# Patient Record
Sex: Female | Born: 1960 | Race: White | Hispanic: No | Marital: Married | Smoking: Former smoker
Health system: Southern US, Community
[De-identification: ages and names within clinical notes are randomized; demographics above are authoritative.]

---

## 1998-03-30 ENCOUNTER — Other Ambulatory Visit: Admission: RE | Admit: 1998-03-30 | Discharge: 1998-03-30 | Payer: Self-pay | Admitting: Obstetrics and Gynecology

## 2004-11-25 ENCOUNTER — Encounter: Admission: RE | Admit: 2004-11-25 | Discharge: 2004-11-25 | Payer: Self-pay | Admitting: General Surgery

## 2006-04-18 ENCOUNTER — Encounter: Admission: RE | Admit: 2006-04-18 | Discharge: 2006-04-18 | Payer: Self-pay | Admitting: Obstetrics and Gynecology

## 2006-04-28 ENCOUNTER — Encounter: Admission: RE | Admit: 2006-04-28 | Discharge: 2006-04-28 | Payer: Self-pay | Admitting: Obstetrics and Gynecology

## 2007-04-06 ENCOUNTER — Emergency Department (HOSPITAL_COMMUNITY): Admission: EM | Admit: 2007-04-06 | Discharge: 2007-04-06 | Payer: Self-pay | Admitting: Family Medicine

## 2007-05-08 ENCOUNTER — Encounter: Admission: RE | Admit: 2007-05-08 | Discharge: 2007-05-08 | Payer: Self-pay | Admitting: Obstetrics and Gynecology

## 2008-05-08 ENCOUNTER — Encounter: Admission: RE | Admit: 2008-05-08 | Discharge: 2008-05-08 | Payer: Self-pay | Admitting: Obstetrics and Gynecology

## 2009-09-29 ENCOUNTER — Encounter: Admission: RE | Admit: 2009-09-29 | Discharge: 2009-09-29 | Payer: Self-pay | Admitting: Obstetrics and Gynecology

## 2010-01-12 ENCOUNTER — Encounter: Admission: RE | Admit: 2010-01-12 | Discharge: 2010-01-12 | Payer: Self-pay | Admitting: Internal Medicine

## 2010-01-12 ENCOUNTER — Other Ambulatory Visit: Admission: RE | Admit: 2010-01-12 | Discharge: 2010-01-12 | Payer: Self-pay | Admitting: Interventional Radiology

## 2010-09-30 ENCOUNTER — Encounter
Admission: RE | Admit: 2010-09-30 | Discharge: 2010-09-30 | Payer: Self-pay | Source: Home / Self Care | Attending: Internal Medicine | Admitting: Internal Medicine

## 2011-01-05 IMAGING — US US BIOPSY
1 series · 13 of 24 positions shown · non-contrast
Comparison: Ultrasound from outside facility revealed a right lower
pole thyroid nodule measuring 1.6 cm in maximum dimension.

CLINICAL DATA: Palpable thyroid nodule.

ULTRASOUND-GUIDED NEEDLE ASPIRATE BIOPSY x2, RIGHT AND LEFT LOBE OF
THYROID
The above procedure was discussed with the patient and written
informed consent was obtained.

[Series 1: us biopsy · 0.08mm/px · 24 acquisitions, 13 frames shown]
[im 1/24]
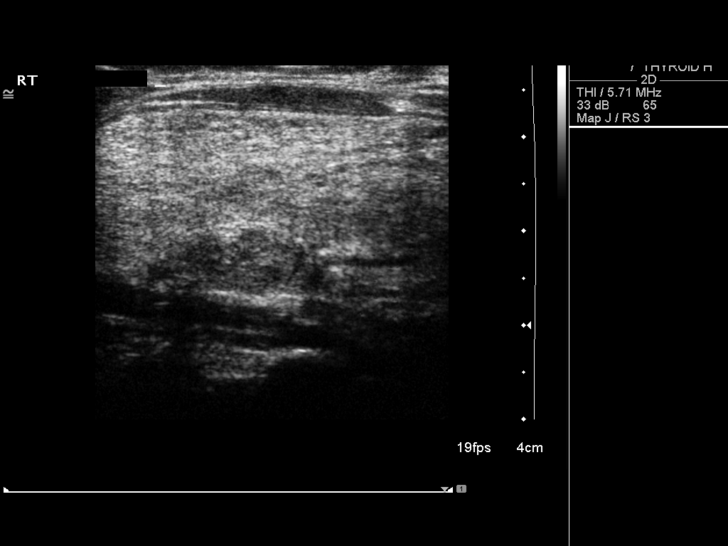
[im 3/24]
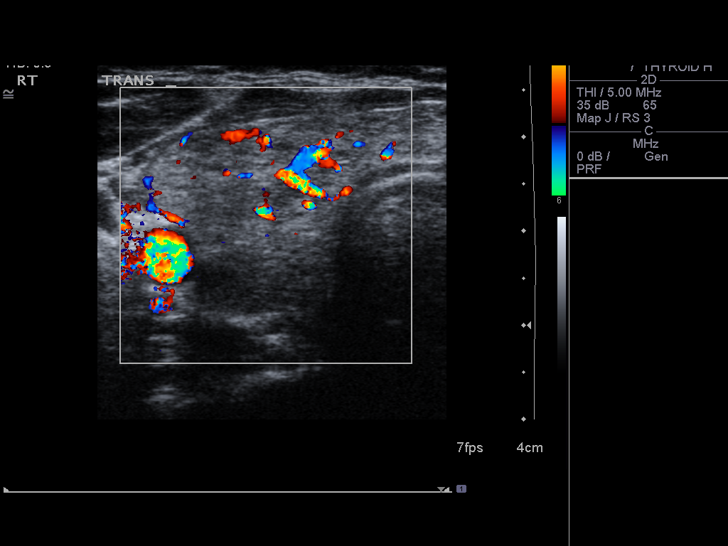
[im 5/24]
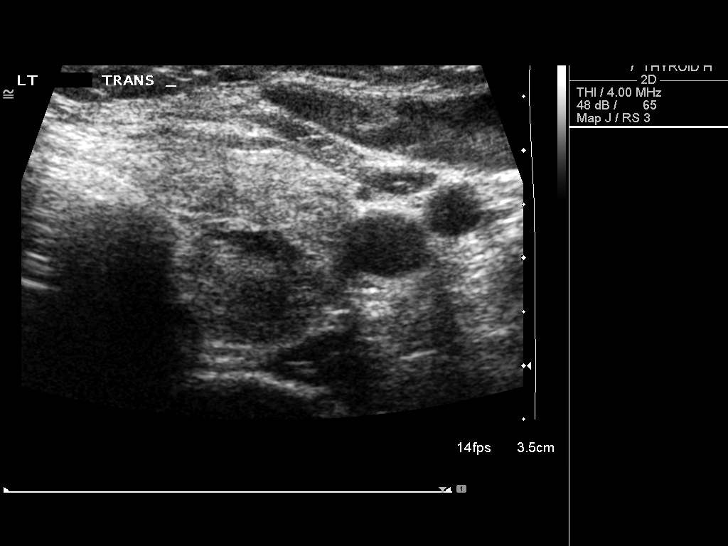
[im 7/24]
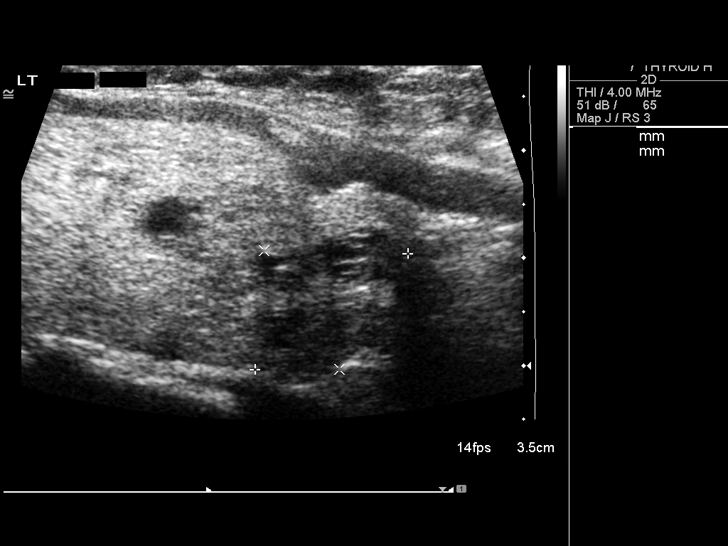
[im 9/24]
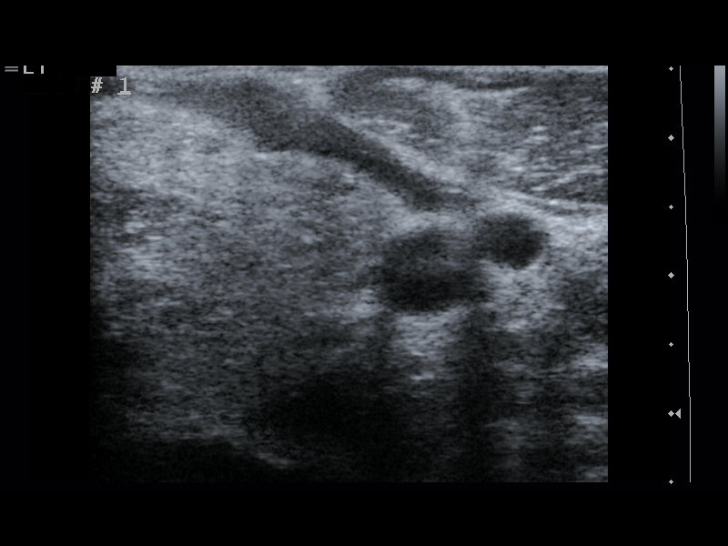
[im 11/24]
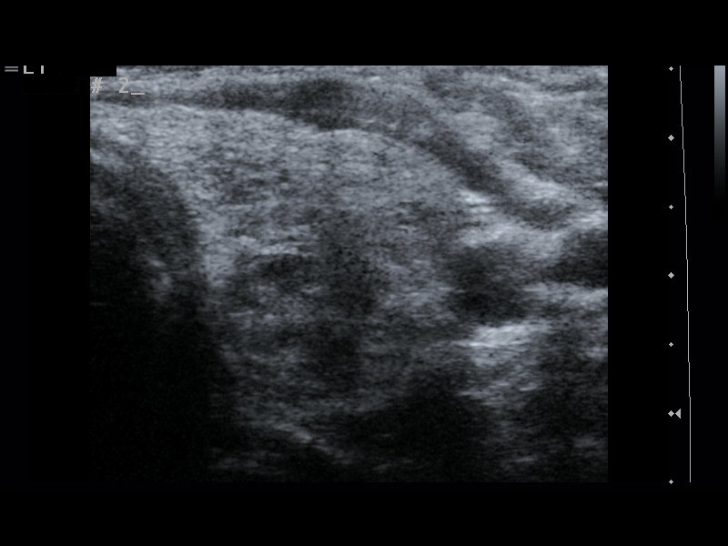
[im 13/24]
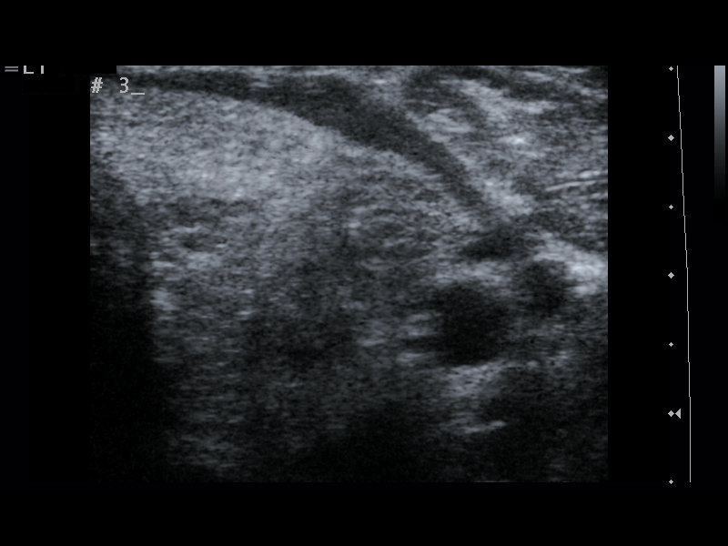
[im 14/24]
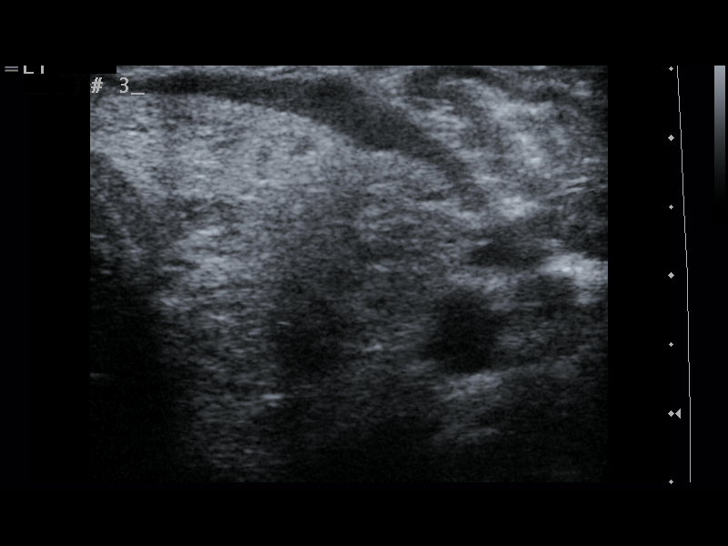
[im 16/24]
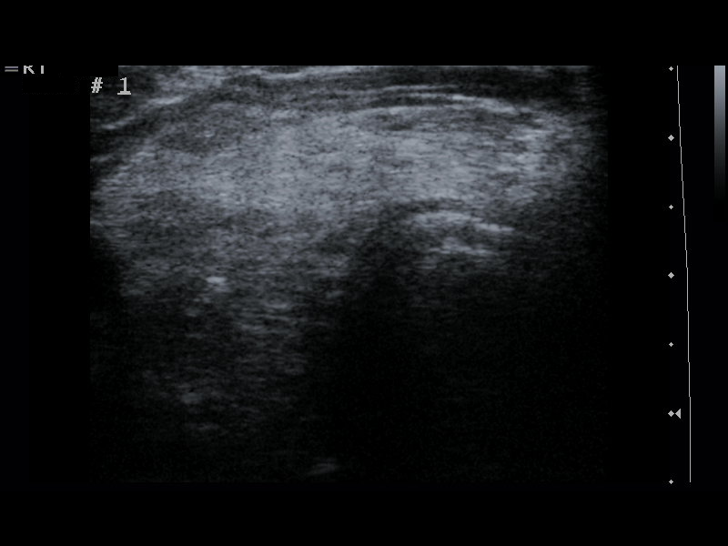
[im 18/24]
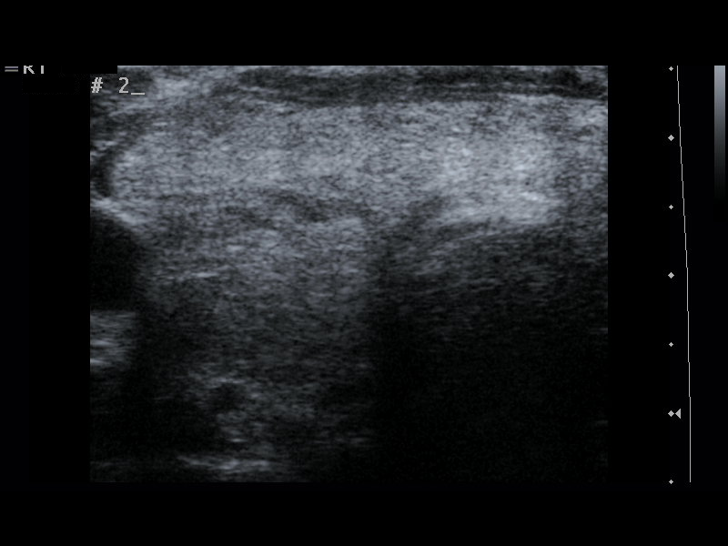
[im 20/24]
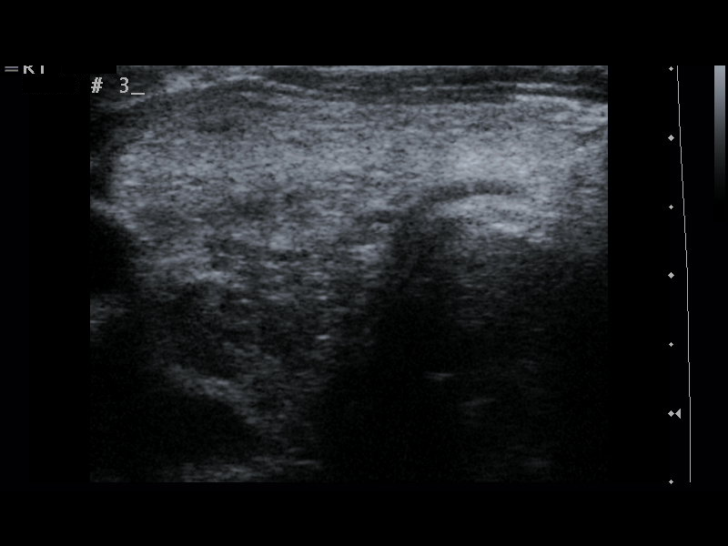
[im 22/24]
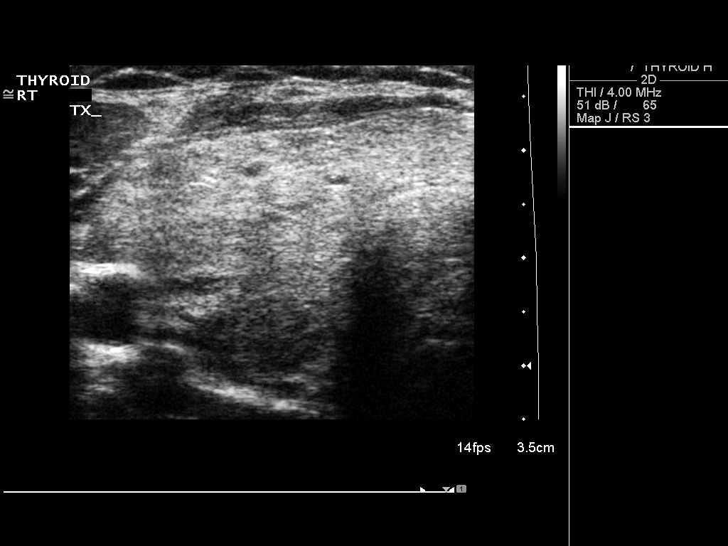
[im 24/24]
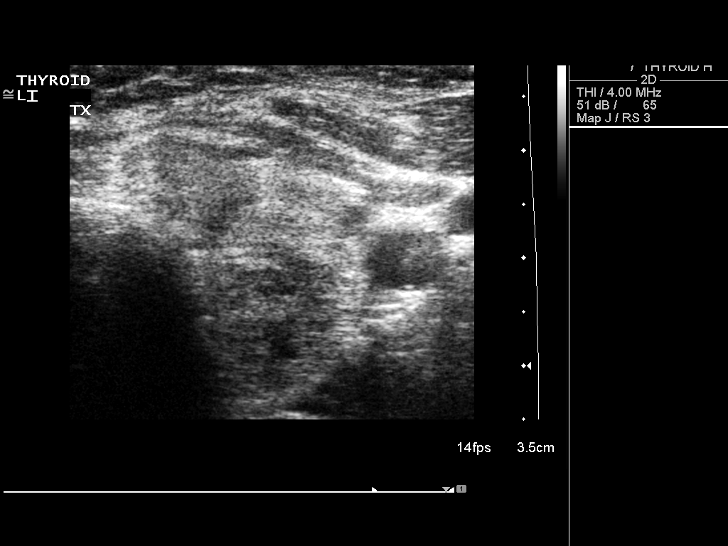

[13 of 24 positions shown; findings below may reference images not displayed]

Ultrasound of the thyroid today reveals a left lower pole thyroid
nodule measuring 1.8 x 1.3 which is not seen on outside imaging.
Plan for biopsy of this nodule today as well as it meets National
criteria status for biopsy.
FINDINGS: Ultrasound was performed to localize and mark an adequate
site for the biopsy.  The patient was then prepped and draped in a
normal sterile fashion.  Local anesthesia was provided with 1%
lidocaine.  Using direct ultrasound guidance, three passes were
made using 25 gauge needles into the nodule within the right lower
lobe of the thyroid.  Ultrasound was used to confirm needle
placements on all occasions.

Ultrasound was performed over the left neck to localize and mark an
adequate site for biopsy.  Local anesthesia was provided with 1%
lidocaine.  Using direct ultrasound guidance, three passes were
made using 25 gauge needles into the nodule within the left lower
lobe of the thyroid.  Ultrasound was used confirm needle placement
on all occasions.

Specimens were sent to Pathology for analysis.  Post procedural
imaging demonstrated no hematoma or immediate complication.  The
patient tolerated the procedure well.
IMPRESSION: Successful fine needle aspirate of right lower pole thyroid nodule
and left lower pole thyroid nodule as described above.

Read by: Noeg, Putu Steven.-MELBORN

## 2012-04-26 ENCOUNTER — Other Ambulatory Visit: Payer: Self-pay | Admitting: Internal Medicine

## 2012-04-26 DIAGNOSIS — Z1231 Encounter for screening mammogram for malignant neoplasm of breast: Secondary | ICD-10-CM

## 2012-05-08 ENCOUNTER — Ambulatory Visit
Admission: RE | Admit: 2012-05-08 | Discharge: 2012-05-08 | Disposition: A | Discharge status: Home / Self Care | Payer: BC Managed Care – PPO | Source: Ambulatory Visit | Attending: Internal Medicine | Admitting: Internal Medicine

## 2012-05-08 DIAGNOSIS — Z1231 Encounter for screening mammogram for malignant neoplasm of breast: Secondary | ICD-10-CM

## 2012-05-14 ENCOUNTER — Other Ambulatory Visit: Payer: Self-pay | Admitting: Internal Medicine

## 2012-05-14 DIAGNOSIS — R928 Other abnormal and inconclusive findings on diagnostic imaging of breast: Secondary | ICD-10-CM

## 2012-05-18 ENCOUNTER — Ambulatory Visit
Admission: RE | Admit: 2012-05-18 | Discharge: 2012-05-18 | Disposition: A | Discharge status: Home / Self Care | Payer: BC Managed Care – PPO | Source: Ambulatory Visit | Attending: Internal Medicine | Admitting: Internal Medicine

## 2012-05-18 DIAGNOSIS — R928 Other abnormal and inconclusive findings on diagnostic imaging of breast: Secondary | ICD-10-CM

## 2013-05-06 ENCOUNTER — Other Ambulatory Visit: Payer: Self-pay

## 2013-05-06 DIAGNOSIS — Z1231 Encounter for screening mammogram for malignant neoplasm of breast: Secondary | ICD-10-CM

## 2013-05-21 ENCOUNTER — Ambulatory Visit
Admission: RE | Admit: 2013-05-21 | Discharge: 2013-05-21 | Disposition: A | Discharge status: Home / Self Care | Payer: BC Managed Care – PPO | Source: Ambulatory Visit

## 2013-05-21 DIAGNOSIS — Z1231 Encounter for screening mammogram for malignant neoplasm of breast: Secondary | ICD-10-CM

## 2013-05-27 ENCOUNTER — Other Ambulatory Visit: Payer: Self-pay | Admitting: Internal Medicine

## 2013-05-27 DIAGNOSIS — R928 Other abnormal and inconclusive findings on diagnostic imaging of breast: Secondary | ICD-10-CM

## 2013-06-07 ENCOUNTER — Ambulatory Visit
Admission: RE | Admit: 2013-06-07 | Discharge: 2013-06-07 | Disposition: A | Discharge status: Home / Self Care | Payer: BC Managed Care – PPO | Source: Ambulatory Visit | Attending: Internal Medicine | Admitting: Internal Medicine

## 2013-06-07 DIAGNOSIS — R928 Other abnormal and inconclusive findings on diagnostic imaging of breast: Secondary | ICD-10-CM

## 2013-06-14 ENCOUNTER — Other Ambulatory Visit: Payer: BC Managed Care – PPO

## 2014-05-09 ENCOUNTER — Other Ambulatory Visit: Payer: Self-pay

## 2014-05-09 DIAGNOSIS — Z1231 Encounter for screening mammogram for malignant neoplasm of breast: Secondary | ICD-10-CM

## 2014-08-05 ENCOUNTER — Ambulatory Visit
Admission: RE | Admit: 2014-08-05 | Discharge: 2014-08-05 | Disposition: A | Discharge status: Home / Self Care | Payer: BC Managed Care – PPO | Source: Ambulatory Visit

## 2014-08-05 DIAGNOSIS — Z1231 Encounter for screening mammogram for malignant neoplasm of breast: Secondary | ICD-10-CM

## 2016-01-27 ENCOUNTER — Other Ambulatory Visit: Payer: Self-pay

## 2016-01-27 DIAGNOSIS — Z1231 Encounter for screening mammogram for malignant neoplasm of breast: Secondary | ICD-10-CM

## 2016-03-25 ENCOUNTER — Ambulatory Visit
Admission: RE | Admit: 2016-03-25 | Discharge: 2016-03-25 | Disposition: A | Discharge status: Home / Self Care | Payer: BC Managed Care – PPO | Source: Ambulatory Visit

## 2016-03-25 DIAGNOSIS — Z1231 Encounter for screening mammogram for malignant neoplasm of breast: Secondary | ICD-10-CM

## 2016-08-11 ENCOUNTER — Encounter: Payer: Self-pay | Admitting: Internal Medicine

## 2017-04-19 ENCOUNTER — Other Ambulatory Visit: Payer: Self-pay | Admitting: Internal Medicine

## 2017-04-19 DIAGNOSIS — Z1231 Encounter for screening mammogram for malignant neoplasm of breast: Secondary | ICD-10-CM

## 2017-04-28 ENCOUNTER — Ambulatory Visit
Admission: RE | Admit: 2017-04-28 | Discharge: 2017-04-28 | Disposition: A | Discharge status: Home / Self Care | Payer: BC Managed Care – PPO | Source: Ambulatory Visit | Attending: Internal Medicine | Admitting: Internal Medicine

## 2017-04-28 DIAGNOSIS — Z1231 Encounter for screening mammogram for malignant neoplasm of breast: Secondary | ICD-10-CM

## 2017-11-09 ENCOUNTER — Encounter: Payer: Self-pay | Admitting: Internal Medicine

## 2018-04-20 ENCOUNTER — Ambulatory Visit (INDEPENDENT_AMBULATORY_CARE_PROVIDER_SITE_OTHER): Payer: BC Managed Care – PPO

## 2018-04-20 ENCOUNTER — Encounter: Payer: Self-pay | Admitting: Podiatry

## 2018-04-20 ENCOUNTER — Ambulatory Visit (INDEPENDENT_AMBULATORY_CARE_PROVIDER_SITE_OTHER): Payer: BC Managed Care – PPO | Admitting: Podiatry

## 2018-04-20 VITALS — BP 131/76 | HR 66 | Resp 16

## 2018-04-20 DIAGNOSIS — S99921A Unspecified injury of right foot, initial encounter: Secondary | ICD-10-CM | POA: Diagnosis not present

## 2018-04-20 DIAGNOSIS — M722 Plantar fascial fibromatosis: Secondary | ICD-10-CM | POA: Diagnosis not present

## 2018-04-20 DIAGNOSIS — S90121A Contusion of right lesser toe(s) without damage to nail, initial encounter: Secondary | ICD-10-CM | POA: Diagnosis not present

## 2018-04-20 MED ORDER — MELOXICAM 15 MG PO TABS: 15.0000 mg | ORAL_TABLET | Freq: Every day | ORAL | 0 refills | Status: DC

## 2018-04-22 NOTE — Progress Notes (Signed)
  Subjective:  Patient ID: Jade Gibson, female    DOB: 05/10/1961,  MRN: [id number]  Chief Complaint  Patient presents with  . Toe Pain    2nd toe right - pt was stretching foot x 2 days ago due to plantar fasciitis symptoms and felt something pull in toe-now sore, bruised and swollen areound base of nail  . Toe Pain    3rd toe right - [place], still tender, red and swollen  . New Patient (Initial Visit)    57 y.o. female presents with the above complaints.  Reports pain to the right second third toes as above. No past medical history on file. No past surgical history on file.  Current Outpatient Medications:  .  meloxicam (MOBIC) 15 MG tablet, Take 1 tablet (15 mg total) by mouth daily., Disp: 30 tablet, Rfl: 0  No Known Allergies Review of Systems: Negative except as noted in the HPI. Denies N/V/F/Ch. Objective:   Vitals:   04/20/18 1019  BP: 131/76  Pulse: 66  Resp: 16   General AA&O x3. Normal mood and affect.  Vascular Dorsalis pedis and posterior tibial pulses  present 2+ bilaterally  Capillary refill normal to all digits. Pedal hair growth normal.  Neurologic Epicritic sensation grossly present.  Dermatologic No open lesions. Interspaces clear of maceration. Nails well groomed and normal in appearance.  Orthopedic: MMT 5/5 in dorsiflexion, plantarflexion, inversion, and eversion. Pain palpation right second MPJ.  Pain palpation right third toe distally.  Positive modified Lachman test   Assessment & Plan:  Patient was evaluated and treated and all questions answered.  Plantar plate injury right second toe -X-rays taken reviewed no acute fractures dislocations. -Right second toe taped in plantar flexion. -Patient educated on performing this taping technique -Rx meloxicam  Return in about 6 weeks (around 06/01/2018) for Plantar PLate injury toe.

## 2018-05-02 ENCOUNTER — Other Ambulatory Visit: Payer: Self-pay | Admitting: Internal Medicine

## 2018-05-02 DIAGNOSIS — Z1231 Encounter for screening mammogram for malignant neoplasm of breast: Secondary | ICD-10-CM

## 2018-05-04 ENCOUNTER — Ambulatory Visit
Admission: RE | Admit: 2018-05-04 | Discharge: 2018-05-04 | Disposition: A | Discharge status: Home / Self Care | Payer: BC Managed Care – PPO | Source: Ambulatory Visit | Attending: Internal Medicine | Admitting: Internal Medicine

## 2018-05-04 DIAGNOSIS — Z1231 Encounter for screening mammogram for malignant neoplasm of breast: Secondary | ICD-10-CM

## 2018-05-07 ENCOUNTER — Other Ambulatory Visit: Payer: BC Managed Care – PPO | Admitting: Orthotics

## 2018-05-14 ENCOUNTER — Other Ambulatory Visit: Payer: Self-pay | Admitting: Podiatry

## 2018-05-31 ENCOUNTER — Ambulatory Visit: Payer: BC Managed Care – PPO | Admitting: Podiatry

## 2021-11-01 ENCOUNTER — Other Ambulatory Visit: Payer: Self-pay | Admitting: Internal Medicine

## 2021-11-01 DIAGNOSIS — E041 Nontoxic single thyroid nodule: Secondary | ICD-10-CM

## 2021-11-01 DIAGNOSIS — E785 Hyperlipidemia, unspecified: Secondary | ICD-10-CM

## 2021-11-26 ENCOUNTER — Ambulatory Visit
Admission: RE | Admit: 2021-11-26 | Discharge: 2021-11-26 | Disposition: A | Discharge status: Home / Self Care | Payer: BC Managed Care – PPO | Source: Ambulatory Visit | Attending: Internal Medicine | Admitting: Internal Medicine

## 2021-11-26 DIAGNOSIS — E041 Nontoxic single thyroid nodule: Secondary | ICD-10-CM

## 2021-12-01 ENCOUNTER — Other Ambulatory Visit: Payer: Self-pay | Admitting: Internal Medicine

## 2021-12-01 DIAGNOSIS — E041 Nontoxic single thyroid nodule: Secondary | ICD-10-CM

## 2021-12-27 ENCOUNTER — Other Ambulatory Visit (HOSPITAL_COMMUNITY)
Admission: RE | Admit: 2021-12-27 | Discharge: 2021-12-27 | Disposition: A | Discharge status: Home / Self Care | Payer: BC Managed Care – PPO | Source: Ambulatory Visit | Attending: Interventional Radiology | Admitting: Interventional Radiology

## 2021-12-27 ENCOUNTER — Ambulatory Visit
Admission: RE | Admit: 2021-12-27 | Discharge: 2021-12-27 | Disposition: A | Discharge status: Home / Self Care | Payer: BC Managed Care – PPO | Source: Ambulatory Visit | Attending: Internal Medicine | Admitting: Internal Medicine

## 2021-12-27 DIAGNOSIS — E041 Nontoxic single thyroid nodule: Secondary | ICD-10-CM | POA: Diagnosis not present

## 2021-12-29 LAB — CYTOLOGY - NON PAP

## 2022-06-20 ENCOUNTER — Other Ambulatory Visit: Payer: Self-pay | Admitting: Internal Medicine

## 2022-06-20 DIAGNOSIS — Z1231 Encounter for screening mammogram for malignant neoplasm of breast: Secondary | ICD-10-CM

## 2022-07-11 ENCOUNTER — Ambulatory Visit
Admission: RE | Admit: 2022-07-11 | Discharge: 2022-07-11 | Disposition: A | Discharge status: Home / Self Care | Payer: BC Managed Care – PPO | Source: Ambulatory Visit | Attending: Internal Medicine | Admitting: Internal Medicine

## 2022-07-11 DIAGNOSIS — Z1231 Encounter for screening mammogram for malignant neoplasm of breast: Secondary | ICD-10-CM

## 2022-11-11 ENCOUNTER — Other Ambulatory Visit: Payer: Self-pay | Admitting: Internal Medicine

## 2022-11-11 DIAGNOSIS — E785 Hyperlipidemia, unspecified: Secondary | ICD-10-CM

## 2022-11-11 DIAGNOSIS — E041 Nontoxic single thyroid nodule: Secondary | ICD-10-CM

## 2022-11-19 IMAGING — US US THYROID
1 series · 13 of 25 positions shown · non-contrast
Comparison: None.

CLINICAL DATA: Goiter.

EXAM:
THYROID ULTRASOUND
TECHNIQUE: Ultrasound examination of the thyroid gland and adjacent soft
tissues was performed.

[Series 1: us thyroid · 0.08mm/px · 13 of 41 slices shown]
[im 1/41]
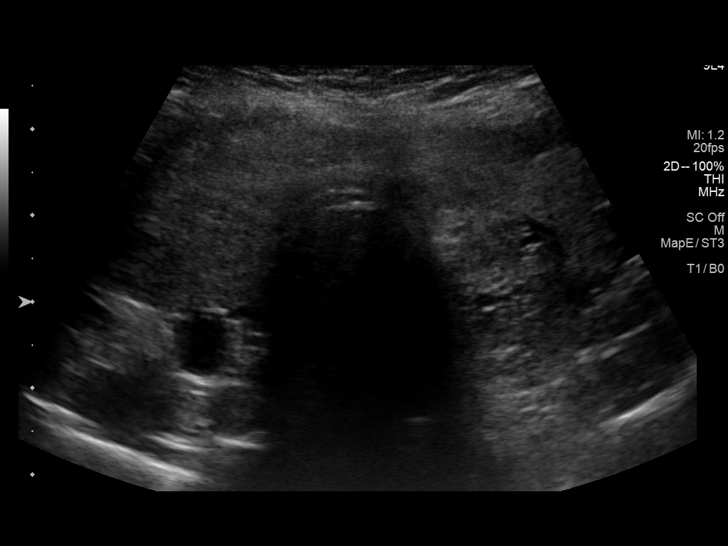
[im 4/41]
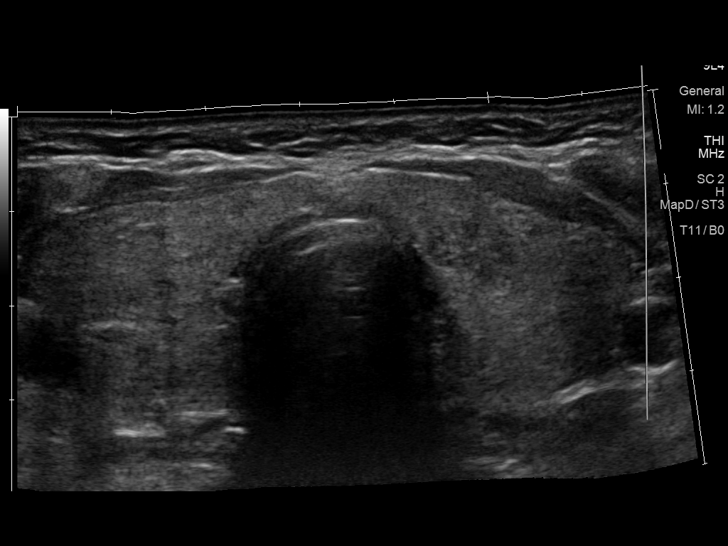
[im 7/41]
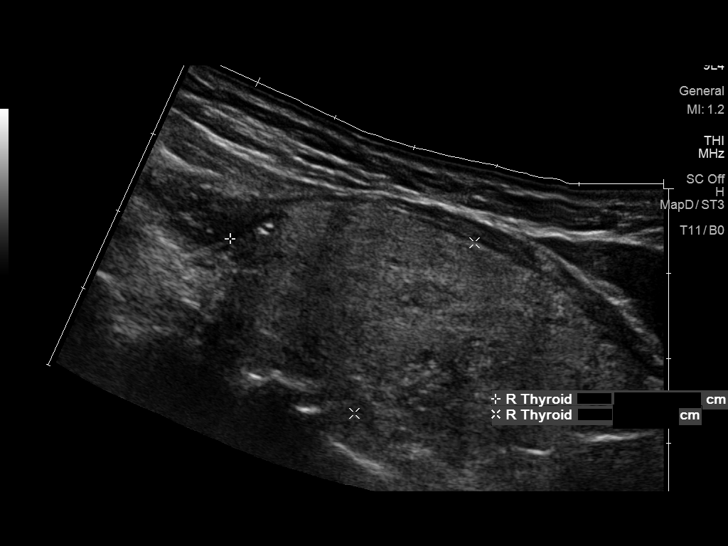
[im 11/41]
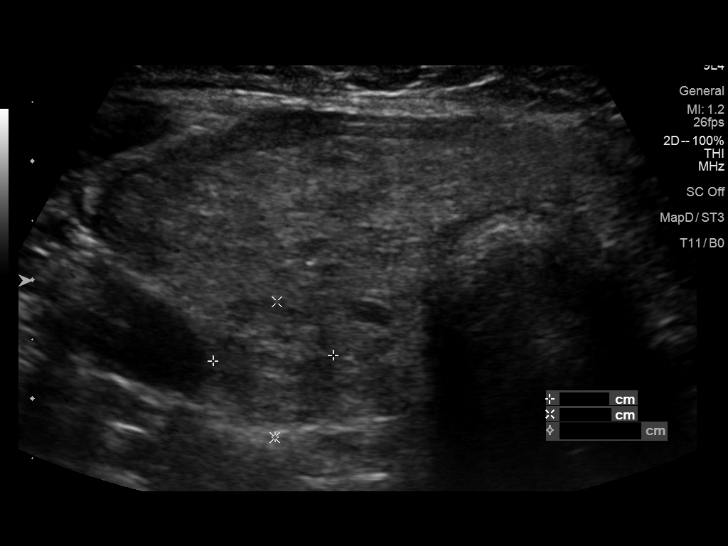
[im 14/41]
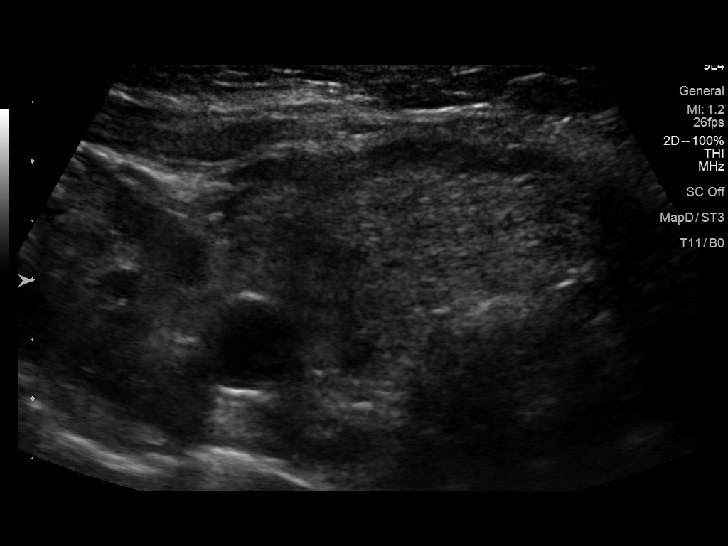
[im 17/41]
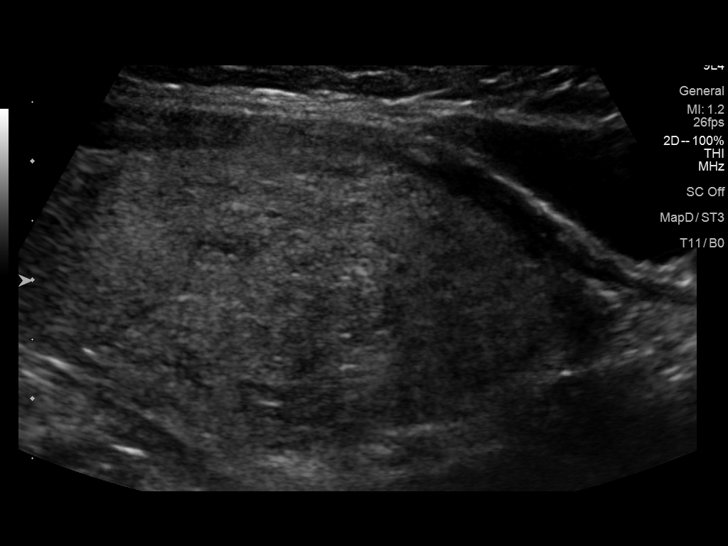
[im 21/41]
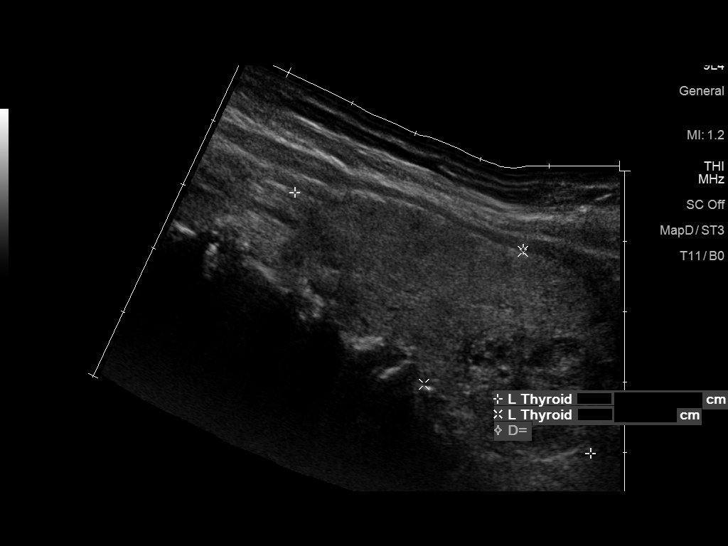
[im 24/41]
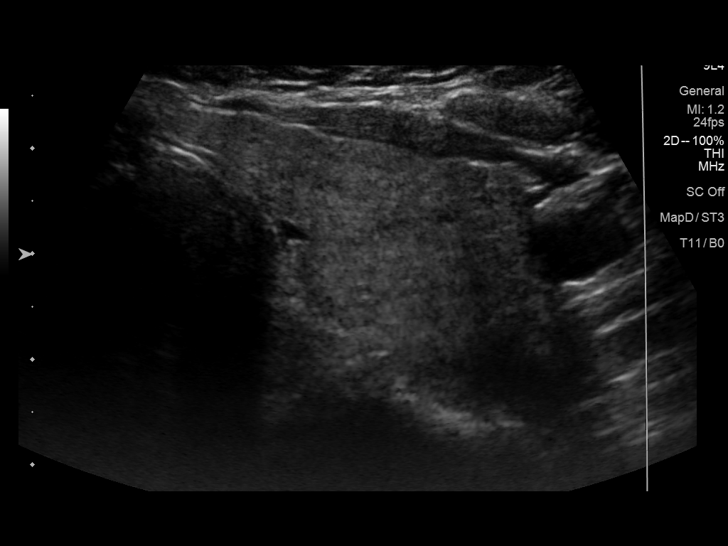
[im 27/41]
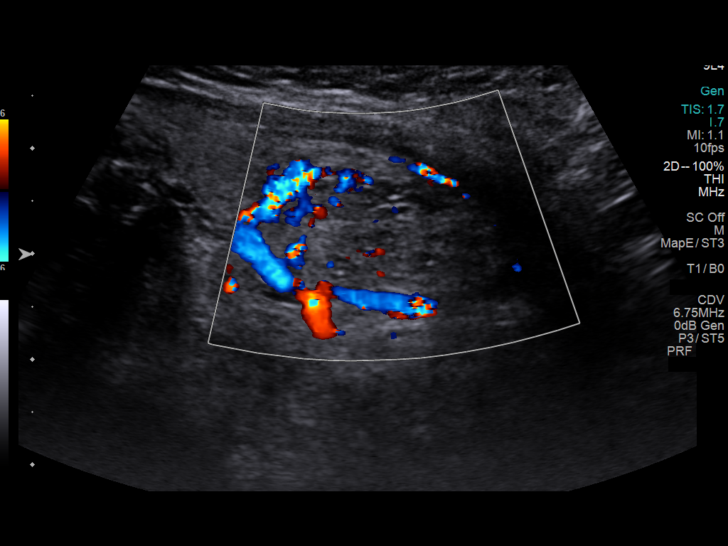
[im 31/41]
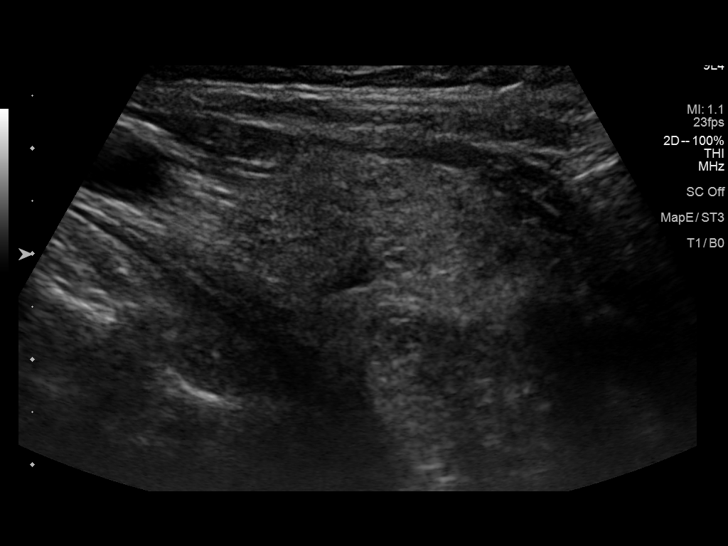
[im 34/41]
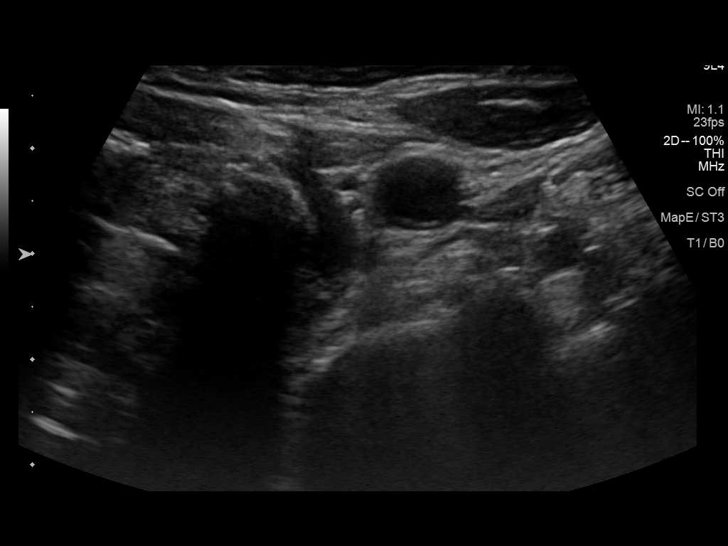
[im 37/41]
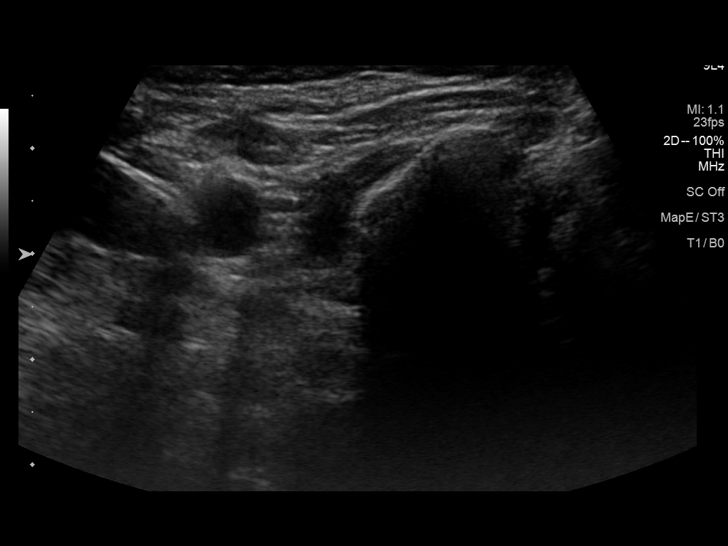
[im 41/41]
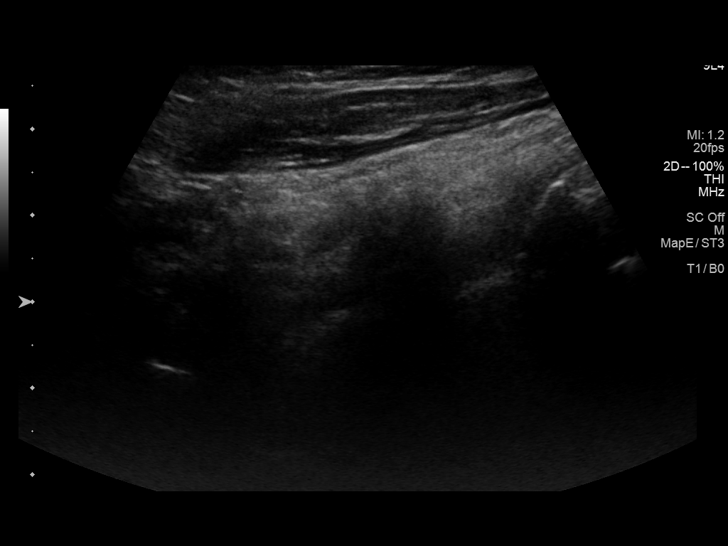

[13 of 25 positions shown; findings below may reference images not displayed]

FINDINGS: Parenchymal Echotexture: Moderately heterogenous

Isthmus: 0.5 cm

Right lobe: 5.3 x 2.5 x 2.5 cm

Left lobe: 5.6 x 2.3 x 2.5 cm

_________________________________________________________

Estimated total number of nodules >/= 1 cm: 3

Number of spongiform nodules >/=  2 cm not described below (TR1): 0

Number of mixed cystic and solid nodules >/= 1.5 cm not described
below (TR2): 0

_________________________________________________________

Nodule # 1:

Location: RIGHT; Mid

Maximum size: 2.2 cm; Other 2 dimensions: 1.1 x 1.0 cm

Composition: solid/almost completely solid (2)

Echogenicity: isoechoic (1)

Shape: not taller-than-wide (0)

Margins: ill-defined (0)

Echogenic foci: macrocalcifications (1)

ACR TI-RADS total points: 4.

ACR TI-RADS risk category: TR4 (4-6 points).

ACR TI-RADS recommendations:

**Given size (>/= 1.5 cm) and appearance, fine needle aspiration of
this moderately suspicious nodule should be considered based on
TI-RADS criteria.

_________________________________________________________

Nodule # 2:

Location: LEFT; Mid

Maximum size: 2.0 cm; Other 2 dimensions: 1.6 x 1.0 cm

Composition: solid/almost completely solid (2)

Echogenicity: isoechoic (1)

Shape: not taller-than-wide (0)

Margins: ill-defined (0)

Echogenic foci: none (0)

ACR TI-RADS total points: 3.

ACR TI-RADS risk category: TR3 (3 points).

ACR TI-RADS recommendations:

*Given size (>/= 1.5 - 2.4 cm) and appearance, a follow-up
ultrasound in 1 year should be considered based on TI-RADS criteria.

_________________________________________________________

Nodule # 3:

Location: LEFT; Inferior

Maximum size: 2.3 cm; Other 2 dimensions: 1.6 x 1.3 cm

Composition: solid/almost completely solid (2)

Echogenicity: isoechoic (1)

Shape: not taller-than-wide (0)

Margins: ill-defined (0)

Echogenic foci: none (0)

ACR TI-RADS total points: 3.

ACR TI-RADS risk category: TR3 (3 points).

ACR TI-RADS recommendations:

*Given size (>/= 1.5 - 2.4 cm) and appearance, a follow-up
ultrasound in 1 year should be considered based on TI-RADS criteria.

_________________________________________________________

No cervical adenopathy or abnormal fluid collection within the
imaged neck.
IMPRESSION: 1. Multinodular thyroid gland.
2. 2.2 cm RIGHT mid TR-4 thyroid nodule.
Fine needle aspiration of this moderately suspicious nodule should
be considered based on TI-RADS criteria.
3. 2.0 cm LEFT mid and 2.3 cm LEFT inferior TR-3 thyroid nodules.
A follow-up ultrasound in 1 year should be considered based on
TI-RADS criteria.

The above is in keeping with the ACR TI-RADS recommendations - [HOSPITAL] [id number];[DATE].

## 2023-08-17 ENCOUNTER — Encounter: Payer: Self-pay | Admitting: Internal Medicine

## 2023-08-17 DIAGNOSIS — Z0000 Encounter for general adult medical examination without abnormal findings: Secondary | ICD-10-CM

## 2023-10-24 ENCOUNTER — Other Ambulatory Visit: Payer: Self-pay | Admitting: Internal Medicine

## 2023-10-24 DIAGNOSIS — Z1231 Encounter for screening mammogram for malignant neoplasm of breast: Secondary | ICD-10-CM

## 2023-10-25 ENCOUNTER — Ambulatory Visit: Payer: Self-pay

## 2023-11-08 ENCOUNTER — Ambulatory Visit
Admission: RE | Admit: 2023-11-08 | Discharge: 2023-11-08 | Discharge status: Home / Self Care | Payer: 59 | Source: Ambulatory Visit | Attending: Internal Medicine | Admitting: Internal Medicine

## 2023-11-08 DIAGNOSIS — Z1231 Encounter for screening mammogram for malignant neoplasm of breast: Secondary | ICD-10-CM

## 2023-11-13 ENCOUNTER — Other Ambulatory Visit: Payer: Self-pay | Admitting: Internal Medicine

## 2023-11-13 DIAGNOSIS — R928 Other abnormal and inconclusive findings on diagnostic imaging of breast: Secondary | ICD-10-CM

## 2023-11-20 ENCOUNTER — Ambulatory Visit
Admission: RE | Admit: 2023-11-20 | Discharge: 2023-11-20 | Disposition: A | Discharge status: Home / Self Care | Payer: 59 | Source: Ambulatory Visit | Attending: Internal Medicine | Admitting: Internal Medicine

## 2023-11-20 ENCOUNTER — Ambulatory Visit: Payer: 59

## 2023-11-20 DIAGNOSIS — R928 Other abnormal and inconclusive findings on diagnostic imaging of breast: Secondary | ICD-10-CM

## 2024-10-30 ENCOUNTER — Other Ambulatory Visit: Payer: Self-pay

## 2024-10-30 ENCOUNTER — Encounter: Payer: Self-pay | Admitting: Orthopaedic Surgery

## 2024-10-30 ENCOUNTER — Ambulatory Visit: Admitting: Orthopaedic Surgery

## 2024-10-30 DIAGNOSIS — M25552 Pain in left hip: Secondary | ICD-10-CM

## 2024-10-30 DIAGNOSIS — M25551 Pain in right hip: Secondary | ICD-10-CM

## 2024-10-30 NOTE — Progress Notes (Signed)
 The patient is a 64 year old patient that I am seeing for the first time however I have taken care of family members.  She comes in with mainly left hip pain.  She says the right hip pops but does not hurt.  The left hip hurts mainly over the trochanteric area and the hamstring areas where she points to.  She says occasionally she has a limp and steps are difficult.  She says she remembers wearing some type of brace when she was very young.  She is otherwise healthy individual and not obese.  On exam I had her lay supine her leg lengths feel near equal.  I can put both hips the range of motion with no pain in the groin and no blocks rotation.  However there is significant pain over proximal trochanteric area of the left hip and her ischial area.  There is a negative straight leg raise.  An AP pelvis and lateral both hips show well-maintained hip joint spaces bilaterally.  I did not see any cortical irregularities around the trochanteric area of either hip or the ischium.  I did not see a significant leg length difference.  I did talk to her about going to physical therapy and is ordering this for any modalities that can decrease her left hip pain around the trochanteric area and ischial area.  Any modalities per the therapist discretion would be worthwhile.  We would then see her back in 6 weeks to see how she done from a therapy standpoint.  If there is still considerable discomfort in this area I would then recommend a MRI of her left hip.  She agrees with this treatment plan.  Will see her back in 6 weeks after course of therapy.

## 2024-10-31 ENCOUNTER — Other Ambulatory Visit: Payer: Self-pay

## 2024-10-31 DIAGNOSIS — M25551 Pain in right hip: Secondary | ICD-10-CM

## 2024-10-31 DIAGNOSIS — M25552 Pain in left hip: Secondary | ICD-10-CM

## 2024-12-11 ENCOUNTER — Ambulatory Visit: Admitting: Orthopaedic Surgery
